# Patient Record
Sex: Female | Born: 1965 | Race: White | Hispanic: No | Marital: Single | Smoking: Current every day smoker
Health system: Southern US, Community
[De-identification: ages and names within clinical notes are randomized; demographics above are authoritative.]

---

## 1998-09-29 ENCOUNTER — Encounter: Payer: Self-pay | Admitting: Emergency Medicine

## 1998-09-29 ENCOUNTER — Emergency Department (HOSPITAL_COMMUNITY): Admission: EM | Admit: 1998-09-29 | Discharge: 1998-09-29 | Payer: Self-pay | Admitting: Emergency Medicine

## 2000-05-18 ENCOUNTER — Emergency Department (HOSPITAL_COMMUNITY): Admission: EM | Admit: 2000-05-18 | Discharge: 2000-05-18 | Payer: Self-pay | Admitting: Emergency Medicine

## 2000-05-18 ENCOUNTER — Encounter: Payer: Self-pay | Admitting: Emergency Medicine

## 2001-01-08 ENCOUNTER — Emergency Department (HOSPITAL_COMMUNITY): Admission: EM | Admit: 2001-01-08 | Discharge: 2001-01-08 | Payer: Self-pay | Admitting: Emergency Medicine

## 2007-03-26 ENCOUNTER — Emergency Department (HOSPITAL_COMMUNITY): Admission: EM | Admit: 2007-03-26 | Discharge: 2007-03-26 | Payer: Self-pay | Admitting: Emergency Medicine

## 2007-07-02 ENCOUNTER — Emergency Department (HOSPITAL_COMMUNITY): Admission: EM | Admit: 2007-07-02 | Discharge: 2007-07-02 | Payer: Self-pay | Admitting: Emergency Medicine

## 2007-08-05 ENCOUNTER — Emergency Department (HOSPITAL_COMMUNITY): Admission: EM | Admit: 2007-08-05 | Discharge: 2007-08-05 | Payer: Self-pay | Admitting: Emergency Medicine

## 2007-09-19 ENCOUNTER — Ambulatory Visit: Payer: Self-pay | Admitting: *Deleted

## 2007-09-19 ENCOUNTER — Emergency Department (HOSPITAL_COMMUNITY): Admission: EM | Admit: 2007-09-19 | Discharge: 2007-09-19 | Payer: Self-pay | Admitting: Emergency Medicine

## 2007-09-19 ENCOUNTER — Inpatient Hospital Stay (HOSPITAL_COMMUNITY): Admission: AD | Admit: 2007-09-19 | Discharge: 2007-09-21 | Payer: Self-pay | Admitting: *Deleted

## 2008-10-06 ENCOUNTER — Emergency Department (HOSPITAL_COMMUNITY): Admission: EM | Admit: 2008-10-06 | Discharge: 2008-10-06 | Payer: Self-pay | Admitting: Emergency Medicine

## 2009-02-06 ENCOUNTER — Observation Stay (HOSPITAL_COMMUNITY): Admission: EM | Admit: 2009-02-06 | Discharge: 2009-02-06 | Payer: Self-pay | Admitting: Emergency Medicine

## 2009-03-22 ENCOUNTER — Emergency Department (HOSPITAL_COMMUNITY): Admission: EM | Admit: 2009-03-22 | Discharge: 2009-03-22 | Payer: Self-pay | Admitting: Emergency Medicine

## 2011-02-21 LAB — POCT I-STAT, CHEM 8
BUN: 4 mg/dL — ABNORMAL LOW (ref 6–23)
Calcium, Ion: 1.13 mmol/L (ref 1.12–1.32)
Chloride: 108 mEq/L (ref 96–112)
Creatinine, Ser: 0.8 mg/dL (ref 0.4–1.2)
Glucose, Bld: 112 mg/dL — ABNORMAL HIGH (ref 70–99)
HCT: 42 % (ref 36.0–46.0)
Hemoglobin: 14.3 g/dL (ref 12.0–15.0)
Potassium: 3.3 mEq/L — ABNORMAL LOW (ref 3.5–5.1)
Sodium: 142 mEq/L (ref 135–145)
TCO2: 22 mmol/L (ref 0–100)

## 2011-02-21 LAB — POCT CARDIAC MARKERS
CKMB, poc: <1 ng/mL — ABNORMAL LOW (ref 1.0–8.0)
Myoglobin, poc: 46.5 ng/mL (ref 12–200)
Troponin i, poc: <0.05 ng/mL (ref 0.00–0.09)

## 2011-02-23 LAB — CBC
MCV: 91.7 fL (ref 78.0–100.0)
RBC: 4.32 MIL/uL (ref 3.87–5.11)
WBC: 10.9 10*3/uL — ABNORMAL HIGH (ref 4.0–10.5)

## 2011-02-23 LAB — URINALYSIS, ROUTINE W REFLEX MICROSCOPIC
Glucose, UA: NEGATIVE mg/dL
Specific Gravity, Urine: 1.011 (ref 1.005–1.030)
pH: 6 (ref 5.0–8.0)

## 2011-02-23 LAB — DIFFERENTIAL
Basophils Relative: 1 % (ref 0–1)
Eosinophils Relative: 2 % (ref 0–5)
Lymphocytes Relative: 19 % (ref 12–46)
Neutrophils Relative %: 74 % (ref 43–77)

## 2011-02-23 LAB — BASIC METABOLIC PANEL
Chloride: 107 mEq/L (ref 96–112)
Creatinine, Ser: 0.66 mg/dL (ref 0.4–1.2)
GFR calc Af Amer: >60 mL/min (ref >60)

## 2011-02-23 LAB — URINE MICROSCOPIC-ADD ON

## 2011-03-28 NOTE — H&P (Signed)
[name], [name]                 ACCOUNT NO.:  [id number]   MEDICAL RECORD NO.:  [id number]          PATIENT TYPE:  IPS   LOCATION:  0602                          FACILITY:  [facility]   PHYSICIAN:  Jasmine Pang, M.D. DATE OF BIRTH:  Oct 11, 1966   DATE OF ADMISSION:  09/19/2007  DATE OF DISCHARGE:  09/21/2007                       PSYCHIATRIC ADMISSION ASSESSMENT   DATE OF ASSESSMENT:  September 20, 2007 at 12:20 p.m.   IDENTIFYING INFORMATION:  A 45 year old white female who is divorced.  This is an involuntary admission.   HISTORY OF PRESENT ILLNESS:  The patient was brought to the hospital  after her mother could not awaken her after she had taken some pills for  a headache.  She says that she took approximately 7 Cephadyn tablets  prescribed by her PCP for headache. Also had reported that she had taken  some additional Tylenol. The patient today denies that she had suicidal  intent and said that she was miserable though and just wanted the  headache to stop.  She denies depressed mood.  Denies depressive  symptoms.  It is also noted that the EMS had reported that the patient  had presented with superficial cuts to her left wrist, and today she is  reporting that these were not self-inflicted but incurred while she was  cleaning up wood in the yard.  She is endorsing occasional use of  marijuana last couple of weeks ago.  Denies any other substance abuse.  Denying any suicidal intent.   PAST PSYCHIATRIC HISTORY:  The patient is here in [facility] mental  health sponsorship. She has no prior history of psychiatric treatment.  Denies any prior hospitalizations. Denies that she has ever taken  medications for depression.  Denies a history of substance abuse other  than the cannabis.   SOCIAL HISTORY:  Divorced white female, has not worked since November of  2007, admitting to having some chronic conflict with her boyfriend. Has  a history of having three children, two who died  at birth, one in the  custody of another friend. She has a tenth grade education, completed a  GED, and has previously worked as a Conservator, museum/gallery.  Currently unemployed. Living with and financially dependent on her  mother who is 3 years of age.   FAMILY HISTORY:  The patient is adopted and has no knowledge of her  biological parents.   ALCOHOL AND DRUG HISTORY:  Noted above.   MEDICAL HISTORY:  Patient is followed by Tammy R. Collins Scotland, M.D. of the  [facility]. Medical problems are migraine headaches. Past medical  history is remarkable for history of seizures with preeclampsia during  her pregnancies and three prior pregnancies.   CURRENT MEDICATIONS:  Cephadyn, which is Tylenol and butalbital 50 mg,  Apap 650 mg.   DRUG ALLERGIES:  NO KNOWN DRUG ALLERGIES.   POSITIVE PHYSICAL FINDINGS:  The patient has some superficial scratches  on her left wrist which do look recent. Did not require suturing. She is  a thin, small built female in no acute distress.  VITAL SIGNS:  On admission to  the unit, temperature 98.2, pulse 61,  respirations 14, blood pressure 106/65.  She is about 5 feet 5 inches  tall and weighed 48 kg.   DIAGNOSTIC STUDIES:  Alcohol level was less than 5.  Urine drug screen  positive for marijuana.  Salicylate level less than 4. Acetaminophen  level was less than 10.  Her CBC WBC 10.8, hemoglobin of 13.7,  hematocrit 39.8, platelets 206,000. Her chemistry sodium 140, potassium  3.7, chloride 110, carbon dioxide 21, BUN 8, creatinine 0.74 and random  glucose 102. Her liver enzymes were normal.  Urine pregnancy test  negative.  Alcohol level less than 5.   MENTAL STATUS EXAM:  Fully alert female.  She is cooperative, rather  solicitous today wanting to be discharged, sees no reason that she needs  to be here.  She had been agitated and uncooperative in the emergency  room and has been irritable with staff somewhat this morning but is  polite and  cooperative in interview today.  Speech is normal in pace,  tone and production, relevant.  Mood is neutral.  She is rather  defensive about her actions yesterday.  Denying any suicidal thoughts.  She is somewhat indignant about the petition but is polite and  cooperative here, requesting to be discharged but denying any suicidal  thought or homicidal thought.  Cognition is intact and oriented x3.  Concentration is within normal limits.  Insight adequate.  Impulse  control and judgment within normal limits.  Concentration and  calculation are intact.   IMPRESSION:  AXIS I:  Rule out depressive disorder NOS.  Cannabis abuse.  AXIS II:  Deferred.  AXIS III:  Status post acetaminophen overdose.  AXIS IV:  Moderate issues with unemployment.  AXIS V:  Current 39.  Past year not known.   PLAN:  To involuntarily admit the patient.  She is currently under  petition. Will give her trazodone 50 mg h.s. p.r.n. insomnia and have  her follow up with Dr. Herb Grays for her migraine headaches.  We are  going to get a hold of her mother today with whom she lives and see if  she can come in for a family session and get some additional information  from her family to corroborate her story and hear their concerns.  Estimated length of stay is 3-5 days.      Margaret A. Lorin Picket, N.P.      Jasmine Pang, M.D.  Electronically Signed    MAS/MEDQ  D:  09/20/2007  T:  09/22/2007  Job:  [id number]

## 2011-03-28 NOTE — Discharge Summary (Signed)
NAMEBRYER, [name]                 ACCOUNT NO.:  [id number]   MEDICAL RECORD NO.:  [id number]          PATIENT TYPE:  IPS   LOCATION:  0602                          FACILITY:  [facility]   PHYSICIAN:  Jasmine Pang, M.D. DATE OF BIRTH:  07/31/1966   DATE OF ADMISSION:  09/19/2007  DATE OF DISCHARGE:  09/21/2007                               DISCHARGE SUMMARY   IDENTIFICATION:  A 45 year old single white female from [place],  [place] who is admitted on involuntary basis.   HISTORY OF PRESENT ILLNESS:  The patient reports increasing depression  after losing her job recently.  She states I am just depressed and I  want to go home.  She states she takes __________  for migraines and it  is no longer effective.  She has been having severe migraines lately.  She reports taking a handful of __________  (has Tylenol in it) to  ease her migraine.  The patient is overwhelmed by financial problems due  to no work.  She is to go for a [employer] in a sponsorship.  She has had no prior treatment.  Here, she has had no prior  hospitalizations.  She has hypertension.  She uses __________  for  migraines.   ALLERGIES:  She is allergic to PENICILLIN AND ASPIRIN.   PHYSICAL FINDINGS:  There are no acute physical or medical findings.  She was evaluated in the ED and the patient was healthy.   LABORATORY DATA:  The admission laboratories, alcohol level was less  than 5.  UDS was positive for marijuana and salicylates less than 4,  acetaminophen less than 10.   HOSPITAL COURSE:  Upon admission, the patient did not want to be on any  medication.  She was friendly, cooperative, and discussed her history of  migraines.  She denied she was suicidal.  She stated, she took extra  medication and her mother could not wake her up easily.  She states that  her words were taken out of contacts when it was reported that she said  she wanted to die.  Her mental status was improved.  She was  initially  anxious about being admitted but on 09/21/2007, she was friendly and  cooperative with good eye contact.  Speech was normal rate and flow.  Psychomotor activity was within normal limits.  Mood was euthymic.  Affect wide range.  There was no suicidal or homicidal ideation.  No  thoughts of self-injuries behavior.  No auditory visual hallucinations.  No paranoid delusions.  Thoughts were logical and goal directed.  Thought content, no predominant theme.  Cognitive was grossly back to  baseline, which was within normal limits.  The patient was felt safe to  be discharged today.  Her mother was going to pick her up.   DISCHARGE DIAGNOSIS:  AXIS I:  Depressive disorder, not otherwise  specified, and also for cannabis abuse.  AXIS II:  None.  AXIS III:  Status post acetaminophen overdose, medically stable.  AXIS IV:  Moderate (issues with occupational problem and economic  problem, burden of migraines).  AXIS  V:  Global assessment of functioning on discharge was 50, global  assessment of functioning on admission was 39, and global assessment of  functioning highest past year was 65 to 70.   DISCHARGE PLAN:  There were no specific activity level or dietary  restrictions.   POSTHOSPITAL CARE PLANS:  The patient will not see a psychiatrist or  therapist.  She states, she does not want this and does not feel she  needs it.  She will follow up with Dr. Herb Grays in 2 weeks for her  migraine headaches.   DISCHARGE MEDICATIONS:  None.      Jasmine Pang, M.D.  Electronically Signed     BHS/MEDQ  D:  09/21/2007  T:  09/21/2007  Job:  [id number]

## 2011-08-15 LAB — URINALYSIS, ROUTINE W REFLEX MICROSCOPIC
Ketones, ur: >80 — AB
Nitrite: NEGATIVE
Protein, ur: NEGATIVE

## 2011-08-15 LAB — URINE MICROSCOPIC-ADD ON

## 2011-08-15 LAB — POCT PREGNANCY, URINE: Preg Test, Ur: NEGATIVE

## 2011-08-22 LAB — COMPREHENSIVE METABOLIC PANEL
ALT: 10
AST: 18
Alkaline Phosphatase: 46
GFR calc Af Amer: >60
Glucose, Bld: 102 — ABNORMAL HIGH
Potassium: 3.7
Sodium: 140
Total Protein: 7.1

## 2011-08-22 LAB — PREGNANCY, URINE: Preg Test, Ur: NEGATIVE

## 2011-08-22 LAB — DIFFERENTIAL
Basophils Relative: 0
Eosinophils Absolute: 0
Eosinophils Relative: 0
Lymphs Abs: 2.9
Monocytes Absolute: 0.6
Monocytes Relative: 6
Neutrophils Relative %: 67

## 2011-08-22 LAB — ACETAMINOPHEN LEVEL
Acetaminophen (Tylenol), Serum: <10 — ABNORMAL LOW
Acetaminophen (Tylenol), Serum: <10 — ABNORMAL LOW

## 2011-08-22 LAB — RAPID URINE DRUG SCREEN, HOSP PERFORMED
Amphetamines: NOT DETECTED
Barbiturates: NOT DETECTED
Benzodiazepines: NOT DETECTED
Tetrahydrocannabinol: POSITIVE — AB

## 2011-08-22 LAB — CBC
Hemoglobin: 13.7
RBC: 4.48
RDW: 14.1 — ABNORMAL HIGH

## 2011-08-22 LAB — ETHANOL: Alcohol, Ethyl (B): <5

## 2015-02-07 ENCOUNTER — Encounter (HOSPITAL_COMMUNITY): Payer: Self-pay | Admitting: *Deleted

## 2015-02-07 ENCOUNTER — Emergency Department (HOSPITAL_COMMUNITY)
Admission: EM | Admit: 2015-02-07 | Discharge: 2015-02-07 | Disposition: A | Discharge status: Home / Self Care | Payer: Self-pay | Attending: Emergency Medicine | Admitting: Emergency Medicine

## 2015-02-07 ENCOUNTER — Emergency Department (HOSPITAL_COMMUNITY): Payer: Self-pay

## 2015-02-07 DIAGNOSIS — S52201A Unspecified fracture of shaft of right ulna, initial encounter for closed fracture: Secondary | ICD-10-CM

## 2015-02-07 DIAGNOSIS — W228XXA Striking against or struck by other objects, initial encounter: Secondary | ICD-10-CM | POA: Insufficient documentation

## 2015-02-07 DIAGNOSIS — Y9389 Activity, other specified: Secondary | ICD-10-CM | POA: Insufficient documentation

## 2015-02-07 DIAGNOSIS — Y998 Other external cause status: Secondary | ICD-10-CM | POA: Insufficient documentation

## 2015-02-07 DIAGNOSIS — Y929 Unspecified place or not applicable: Secondary | ICD-10-CM | POA: Insufficient documentation

## 2015-02-07 DIAGNOSIS — S52601A Unspecified fracture of lower end of right ulna, initial encounter for closed fracture: Secondary | ICD-10-CM | POA: Insufficient documentation

## 2015-02-07 DIAGNOSIS — Z720 Tobacco use: Secondary | ICD-10-CM | POA: Insufficient documentation

## 2015-02-07 MED ORDER — NAPROXEN 250 MG PO TABS
500.0000 mg | ORAL_TABLET | Freq: Once | ORAL | Status: AC
  Administered 2015-02-07: 500 mg via ORAL
  Filled 2015-02-07: qty 2

## 2015-02-07 MED ORDER — OXYCODONE-ACETAMINOPHEN 5-325 MG PO TABS: 1.0000 | ORAL_TABLET | Freq: Four times a day (QID) | ORAL | Status: AC | PRN

## 2015-02-07 MED ORDER — OXYCODONE-ACETAMINOPHEN 5-325 MG PO TABS
1.0000 | ORAL_TABLET | Freq: Once | ORAL | Status: AC
  Administered 2015-02-07: 1 via ORAL
  Filled 2015-02-07: qty 1

## 2015-02-07 MED ORDER — NAPROXEN 500 MG PO TABS
500.0000 mg | ORAL_TABLET | Freq: Two times a day (BID) | ORAL | Status: AC
Start: 2015-02-07 — End: ?

## 2015-02-07 NOTE — ED Notes (Signed)
The pt was p,laying with her dig ans struck her   Rt wrist on the sofa c/o pain since then

## 2015-02-07 NOTE — Discharge Instructions (Signed)
Recommend elevation as much as possible and icing 3-4 times per day. Take Naproxen as prescribed and percocet as needed for severe pain. Follow up with an orthopedic hand specialist to ensure proper healing. Do not remove your splint unless otherwise instructed by an orthopedic hand specialist.  Forearm Fracture Your caregiver has diagnosed you as having a broken bone (fracture) of the forearm. This is the part of your arm between the elbow and your wrist. Your forearm is made up of two bones. These are the radius and ulna. A fracture is a break in one or both bones. A cast or splint is used to protect and keep your injured bone from moving. The cast or splint will be on generally for about 5 to 6 weeks, with individual variations. HOME CARE INSTRUCTIONS   Keep the injured part elevated while sitting or lying down. Keeping the injury above the level of your heart (the center of the chest). This will decrease swelling and pain.  Apply ice to the injury for 15-20 minutes, 03-04 times per day while awake, for 2 days. Put the ice in a plastic bag and place a thin towel between the bag of ice and your cast or splint.  If you have a plaster or fiberglass cast:  Do not try to scratch the skin under the cast using sharp or pointed objects.  Check the skin around the cast every day. You may put lotion on any red or sore areas.  Keep your cast dry and clean.  If you have a plaster splint:  Wear the splint as directed.  You may loosen the elastic around the splint if your fingers become numb, tingle, or turn cold or blue.  Do not put pressure on any part of your cast or splint. It may break. Rest your cast only on a pillow the first 24 hours until it is fully hardened.  Your cast or splint can be protected during bathing with a plastic bag. Do not lower the cast or splint into water.  Only take over-the-counter or prescription medicines for pain, discomfort, or fever as directed by your  caregiver. SEEK IMMEDIATE MEDICAL CARE IF:   Your cast gets damaged or breaks.  You have more severe pain or swelling than you did before the cast.  Your skin or nails below the injury turn blue or gray, or feel cold or numb.  There is a bad smell or new stains and/or pus like (purulent) drainage coming from under the cast. MAKE SURE YOU:   Understand these instructions.  Will watch your condition.  Will get help right away if you are not doing well or get worse. Document Released: 10/27/2000 Document Revised: 01/22/2012 Document Reviewed: 06/18/2008 [facility] Patient Information 2015 [place]. This information is not intended to replace advice given to you by your health care provider. Make sure you discuss any questions you have with your health care provider.  RICE: Routine Care for Injuries The routine care of many injuries includes Rest, Ice, Compression, and Elevation (RICE). HOME CARE INSTRUCTIONS  Rest is needed to allow your body to heal. Routine activities can usually be resumed when comfortable. Injured tendons and bones can take up to 6 weeks to heal. Tendons are the cord-like structures that attach muscle to bone.  Ice following an injury helps keep the swelling down and reduces pain.  Put ice in a plastic bag.  Place a towel between your skin and the bag.  Leave the ice on for 15-20 minutes,  3-4 times a day, or as directed by your health care provider. Do this while awake, for the first 24 to 48 hours. After that, continue as directed by your caregiver.  Compression helps keep swelling down. It also gives support and helps with discomfort. If an elastic bandage has been applied, it should be removed and reapplied every 3 to 4 hours. It should not be applied tightly, but firmly enough to keep swelling down. Watch fingers or toes for swelling, bluish discoloration, coldness, numbness, or excessive pain. If any of these problems occur, remove the bandage and reapply  loosely. Contact your caregiver if these problems continue.  Elevation helps reduce swelling and decreases pain. With extremities, such as the arms, hands, legs, and feet, the injured area should be placed near or above the level of the heart, if possible. SEEK IMMEDIATE MEDICAL CARE IF:  You have persistent pain and swelling.  You develop redness, numbness, or unexpected weakness.  Your symptoms are getting worse rather than improving after several days. These symptoms may indicate that further evaluation or further X-rays are needed. Sometimes, X-rays may not show a small broken bone (fracture) until 1 week or 10 days later. Make a follow-up appointment with your caregiver. Ask when your X-ray results will be ready. Make sure you get your X-ray results. Document Released: 02/11/2001 Document Revised: 11/04/2013 Document Reviewed: 03/31/2011 [facility] Patient Information 2015 [place], [place]. This information is not intended to replace advice given to you by your health care provider. Make sure you discuss any questions you have with your health care provider.

## 2015-02-07 NOTE — ED Provider Notes (Signed)
CSN: [id number]     Arrival date & time 02/07/15  0141 History   First MD Initiated Contact with Patient 02/07/15 0258     Chief Complaint  Patient presents with  . Wrist Pain     (Consider location/radiation/quality/duration/timing/severity/associated sxs/prior Treatment) HPI Comments: 49 year old female presents to the emergency department for further evaluation of right forearm pain. Symptom onset after wrist and forearm struck sofa while patient was playing with her [place] dog. Impression and icing attempted prior to arrival. No medications taken for symptoms.  Patient is a 49 y.o. female presenting with wrist pain and arm injury. The history is provided by the patient. No language interpreter was used.  Wrist Pain Associated symptoms include arthralgias and joint swelling.  Arm Injury Location:  Arm Arm location:  R forearm Pain details:    Quality:  Sharp and aching   Radiates to:  Does not radiate   Severity:  Moderate   Onset quality:  Sudden   Duration:  4 hours   Timing:  Constant   Progression:  Waxing and waning Chronicity:  New Dislocation: no   Prior injury to area:  No Relieved by: compression and icing. Worsened by:  Movement Ineffective treatments:  Rest Associated symptoms: swelling and tingling   Associated symptoms: no decreased range of motion, no muscle weakness and no numbness   Risk factors: no frequent fractures     History reviewed. No pertinent past medical history. History reviewed. No pertinent past surgical history. No family history on file. History  Substance Use Topics  . Smoking status: Current Every Day Smoker  . Smokeless tobacco: Not on file  . Alcohol Use: No   OB History    No data available      Review of Systems  Musculoskeletal: Positive for joint swelling and arthralgias.  All other systems reviewed and are negative.   Allergies  Tagamet  Home Medications   Prior to Admission medications   Medication Sig  Start Date End Date Taking? Authorizing Provider  naproxen (NAPROSYN) 500 MG tablet Take 1 tablet (500 mg total) by mouth 2 (two) times daily. 02/07/15   Antony MaduraKelly Andraya Frigon, PA-C  oxyCODONE-acetaminophen (PERCOCET/ROXICET) 5-325 MG per tablet Take 1 tablet by mouth every 6 (six) hours as needed for severe pain. 02/07/15   Antony MaduraKelly Silena Wyss, PA-C   BP 122/74 mmHg  Pulse 70  Temp(Src) 98 F (36.7 C)  Resp 18  Ht 5' 5.5" (1.664 m)  Wt 109 lb (49.442 kg)  BMI 17.86 kg/m2  SpO2 97%   Physical Exam  Constitutional: She is oriented to person, place, and time. She appears well-developed and well-nourished. No distress.  Nontoxic/nonseptic appearing  HENT:  Head: Normocephalic and atraumatic.  Eyes: Conjunctivae and EOM are normal. No scleral icterus.  Neck: Normal range of motion.  Cardiovascular: Normal rate, regular rhythm and intact distal pulses.   Distal radial pulse 2+ in right upper extremity. Capillary refill brisk in all digits of right hand.  Pulmonary/Chest: Effort normal. No respiratory distress.  Respirations even and unlabored  Musculoskeletal: Normal range of motion. She exhibits tenderness.       Right wrist: She exhibits normal range of motion, no bony tenderness, no swelling, no effusion and no crepitus.       Right forearm: She exhibits tenderness, bony tenderness and swelling. She exhibits no deformity and no laceration.       Arms:      Right hand: Normal.  TTP to R distal forearm along ulnar aspect  with mild soft tissue swelling and ecchymosis.  Neurological: She is alert and oriented to person, place, and time. She exhibits normal muscle tone. Coordination normal.  Sensation to light touch intact. Finger to thumb opposition intact. Good grip strength in the right upper extremity.  Skin: Skin is warm and dry. No rash noted. She is not diaphoretic. No erythema. No pallor.  Psychiatric: She has a normal mood and affect. Her behavior is normal.  Nursing note and vitals  reviewed.   ED Course  Procedures (including critical care time) Labs Review Labs Reviewed - No data to display  Imaging Review Dg Wrist Complete Right  02/07/2015   CLINICAL DATA:  Right wrist pain after injury, arm hit back of couch.  EXAM: RIGHT WRIST - COMPLETE 3+ VIEW  COMPARISON:  None.  FINDINGS: There is a nondisplaced fracture of the distal ulnar shaft. No extension to the distal radial ulnar joint. Distal radius is intact. Wrist alignment is maintained. No additional fracture.  IMPRESSION: Nondisplaced fracture of the distal ulnar shaft.   Electronically Signed   By: Rubye Oaks M.D.   On: 02/07/2015 02:52     EKG Interpretation None      MDM   Final diagnoses:  Ulnar shaft fracture, right, closed, initial encounter    49 year old female presents to the emergency department for further evaluation of right wrist/forearm pain after striking her arm on a sofa while playing with her dog this evening. Patient is neurovascularly intact with point tenderness along the ulnar aspect of her distal forearm. Point tenderness consistent with nondisplaced fracture site of the distal ulnar shaft. Patient splinted in ED and given Percocet for pain. She is stable for outpatient management and hand specialist follow-up to ensure proper healing. RICE and NSAIDs advised and return precautions provided. Patient agreeable to plan with no unaddressed concerns. Patient discharged in good condition.   Filed Vitals:   02/07/15 0146  BP: 122/74  Pulse: 70  Temp: 98 F (36.7 C)  Resp: 18  Height: 5' 5.5" (1.664 m)  Weight: 109 lb (49.442 kg)  SpO2: 97%     Antony Madura, PA-C 02/07/15 0327  Devoria Albe, MD 02/07/15 0745

## 2015-02-07 NOTE — Progress Notes (Signed)
Orthopedic Tech Progress Note Patient Details:  Georgina Peerina R Maddux 10/06/1966 [id number]  Ortho Devices Type of Ortho Device: Ulna gutter splint Ortho Device/Splint Interventions: Application   Haskell Flirtewsome, Galilee Pierron M 02/07/2015, 3:11 AM

## 2016-05-08 IMAGING — CR DG WRIST COMPLETE 3+V*R*
4 series · 4 of 4 positions shown · non-contrast
Comparison: None.

CLINICAL DATA: Right wrist pain after injury, arm hit back of
couch.

EXAM:
RIGHT WRIST - COMPLETE 3+ VIEW

[wrist pa]
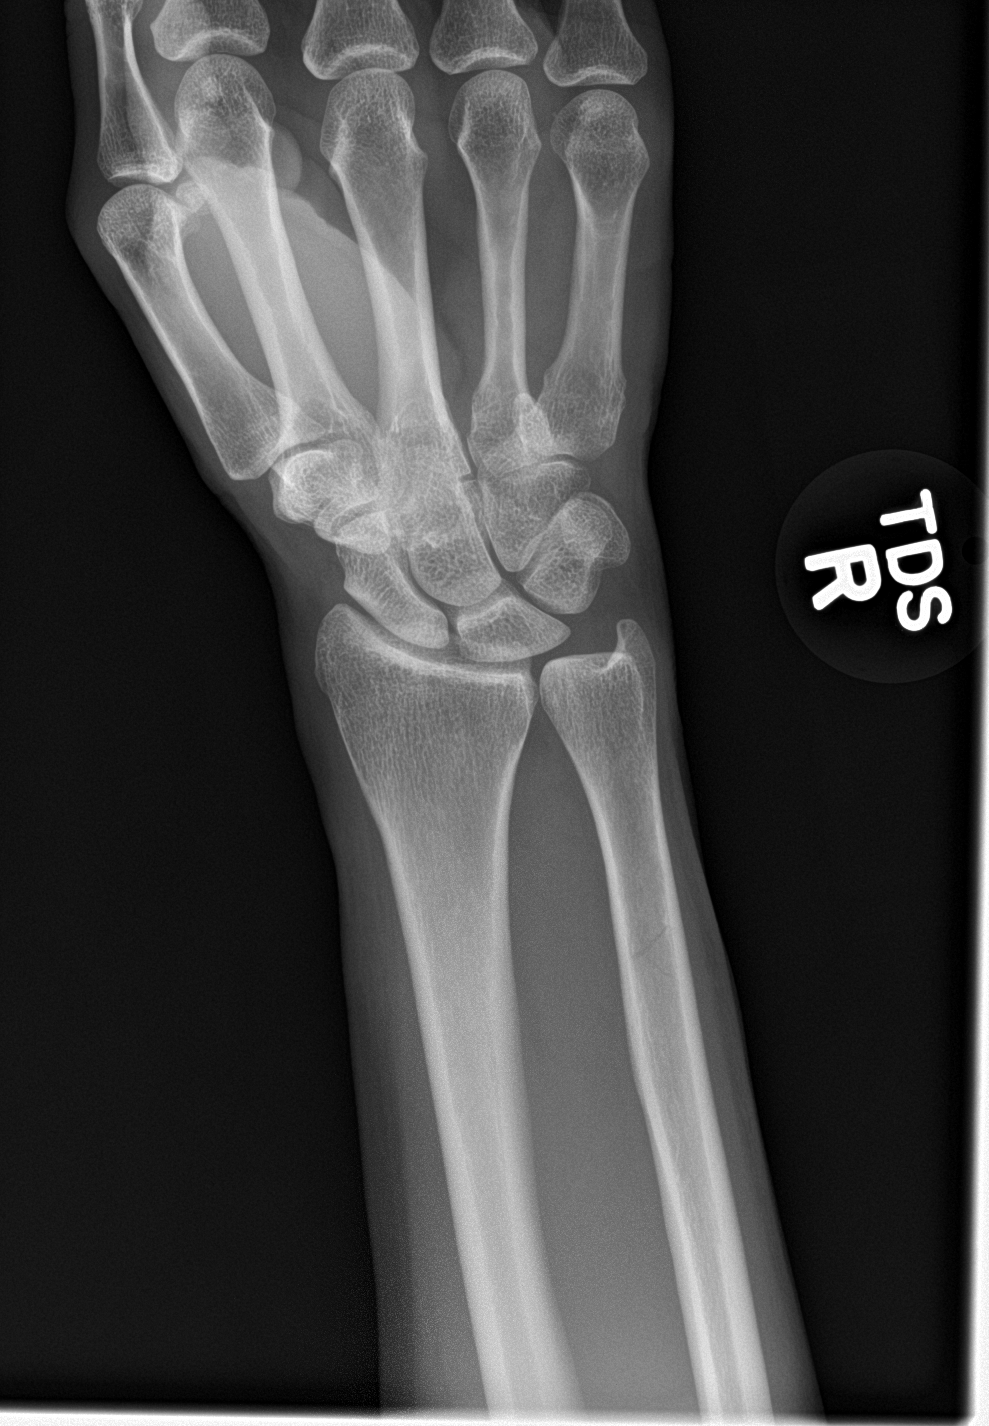

[wrist obl]
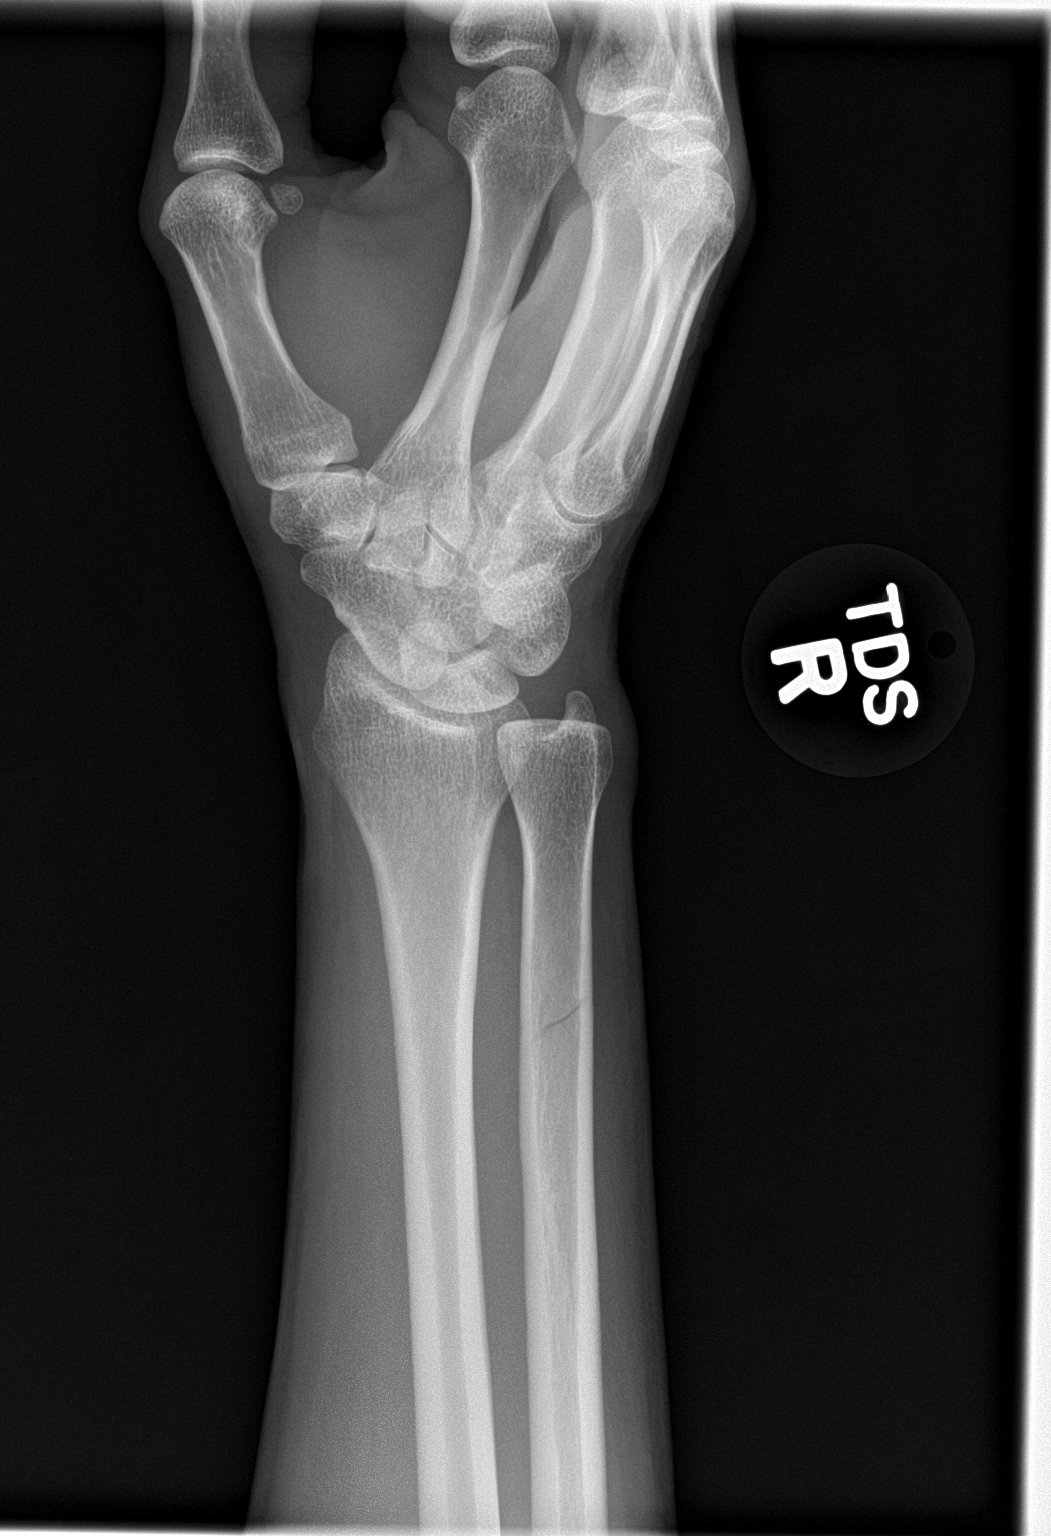

[wrist lat]
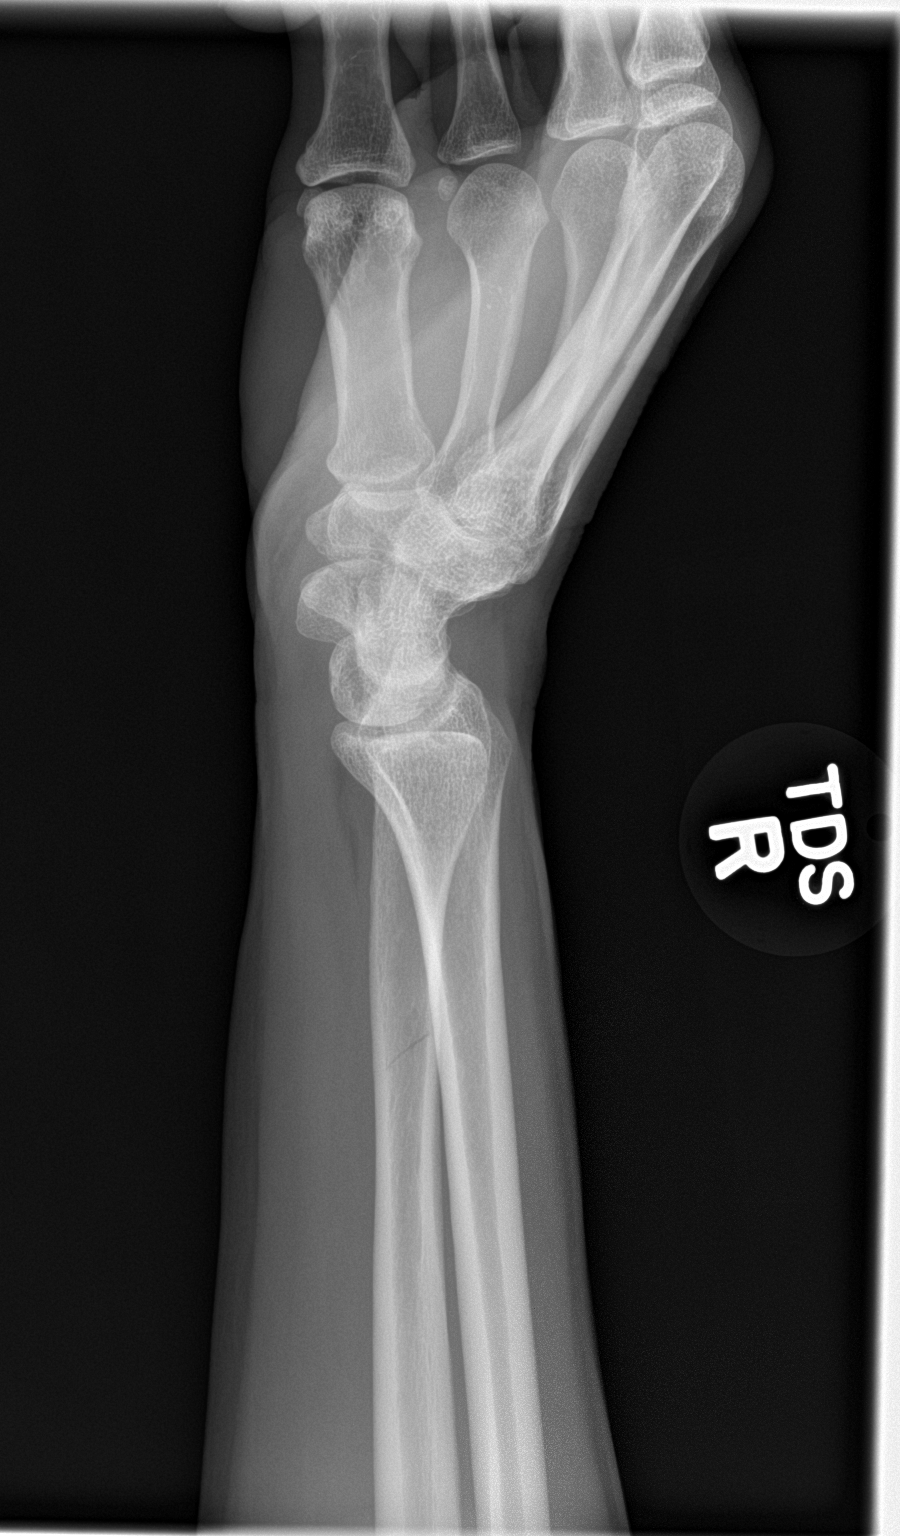

[wrist navicular]
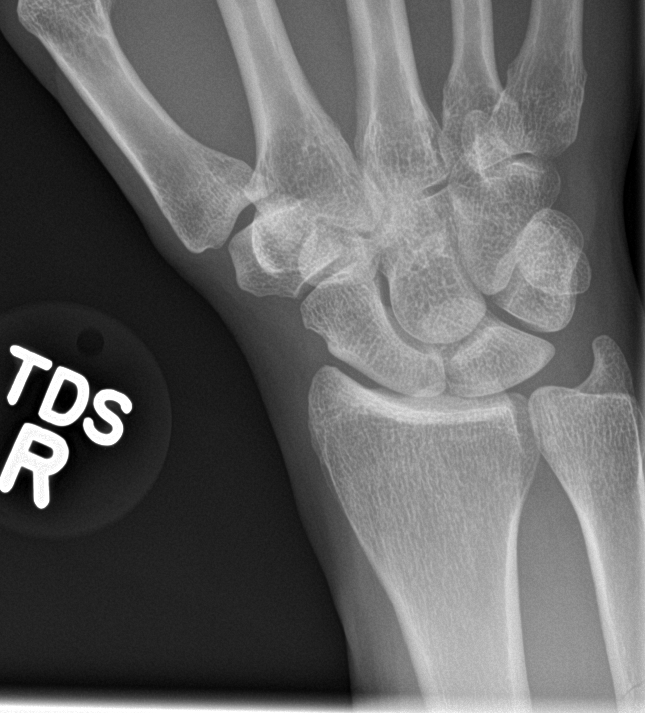

[4 of 4 positions shown; findings below may reference images not displayed]

FINDINGS: There is a nondisplaced fracture of the distal ulnar shaft. No
extension to the distal radial ulnar joint. Distal radius is intact.
Wrist alignment is maintained. No additional fracture.
IMPRESSION: Nondisplaced fracture of the distal ulnar shaft.

## 2016-08-04 ENCOUNTER — Encounter (HOSPITAL_COMMUNITY): Payer: Self-pay | Admitting: Emergency Medicine

## 2016-08-04 ENCOUNTER — Emergency Department (HOSPITAL_COMMUNITY)
Admission: EM | Admit: 2016-08-04 | Discharge: 2016-08-04 | Disposition: A | Discharge status: Home / Self Care | Payer: Self-pay | Attending: Emergency Medicine | Admitting: Emergency Medicine

## 2016-08-04 ENCOUNTER — Emergency Department (HOSPITAL_COMMUNITY): Payer: Self-pay

## 2016-08-04 DIAGNOSIS — R0789 Other chest pain: Secondary | ICD-10-CM | POA: Insufficient documentation

## 2016-08-04 DIAGNOSIS — F17200 Nicotine dependence, unspecified, uncomplicated: Secondary | ICD-10-CM | POA: Insufficient documentation

## 2016-08-04 DIAGNOSIS — R0781 Pleurodynia: Secondary | ICD-10-CM

## 2016-08-04 DIAGNOSIS — Z79899 Other long term (current) drug therapy: Secondary | ICD-10-CM | POA: Insufficient documentation

## 2016-08-04 MED ORDER — IBUPROFEN 800 MG PO TABS: 800.0000 mg | ORAL_TABLET | Freq: Three times a day (TID) | ORAL | 0 refills | Status: AC | PRN

## 2016-08-04 MED ORDER — HYDROCODONE-ACETAMINOPHEN 5-325 MG PO TABS
1.0000 | ORAL_TABLET | Freq: Once | ORAL | Status: AC
  Administered 2016-08-04: 1 via ORAL
  Filled 2016-08-04: qty 1

## 2016-08-04 MED ORDER — HYDROCODONE-ACETAMINOPHEN 5-325 MG PO TABS: 1.0000 | ORAL_TABLET | Freq: Four times a day (QID) | ORAL | 0 refills | Status: AC | PRN

## 2016-08-04 NOTE — ED Triage Notes (Signed)
Pt sts right rib and back pain worse with inspiration and cough after falling on Tuesday

## 2016-08-04 NOTE — ED Notes (Signed)
Pt states she understands instruction  And is home stable with boyfriend and with steady gait.

## 2016-08-04 NOTE — ED Notes (Signed)
Pts boyfriend at bedside.

## 2016-08-04 NOTE — ED Provider Notes (Signed)
[facility]-EMERGENCY DEPT Provider Note   CSN: [id number] Arrival date & time: 08/04/16  [id number]     History   Chief Complaint Chief Complaint  Patient presents with  . rib pain  . Back Pain    HPI [name] is a 50 y.o. female.  The history is provided by the patient and medical records. No language interpreter was used.  Back Pain   Pertinent negatives include no fever, no headaches, no abdominal pain and no dysuria.   [name] is an otherwise healthy 50 y.o. female  who presents to the Emergency Department complaining of worsening right lateral rib pain x 3 days which began shortly after moving furniture. No other injury or fall. Pain is worse with certain movements and deep breathing. Tylenol and ice with no relief. No chest pain, shortness of breath, abdominal pain. Patient denies back pain but states that rib pain does feel like it will radiate to her back with certain movements.   History reviewed. No pertinent past medical history.  There are no active problems to display for this patient.   History reviewed. No pertinent surgical history.  OB History    No data available       Home Medications    Prior to Admission medications   Medication Sig Start Date End Date Taking? Authorizing Provider  acetaminophen (TYLENOL) 500 MG tablet Take 500 mg by mouth every 6 (six) hours as needed for mild pain.   Yes Historical Provider, MD  ibuprofen (ADVIL,MOTRIN) 200 MG tablet Take 400 mg by mouth every 6 (six) hours as needed.   Yes Historical Provider, MD  HYDROcodone-acetaminophen (NORCO/VICODIN) 5-325 MG tablet Take 1 tablet by mouth every 6 (six) hours as needed for severe pain. 08/04/16   Chase PicketJaime Pilcher Terrea Bruster, PA-C  ibuprofen (ADVIL,MOTRIN) 800 MG tablet Take 1 tablet (800 mg total) by mouth every 8 (eight) hours as needed for mild pain or moderate pain. 08/04/16   Chase PicketJaime Pilcher Enrrique Mierzwa, PA-C  naproxen (NAPROSYN) 500 MG tablet Take 1 tablet (500 mg total) by mouth 2 (two)  times daily. Patient not taking: Reported on 08/04/2016 02/07/15   Antony MaduraKelly Humes, PA-C  oxyCODONE-acetaminophen (PERCOCET/ROXICET) 5-325 MG per tablet Take 1 tablet by mouth every 6 (six) hours as needed for severe pain. Patient not taking: Reported on 08/04/2016 02/07/15   Antony MaduraKelly Humes, PA-C    Family History History reviewed. No pertinent family history.  Social History Social History  Substance Use Topics  . Smoking status: Current Every Day Smoker  . Smokeless tobacco: Never Used  . Alcohol use No     Allergies   Tagamet [cimetidine]   Review of Systems Review of Systems  Constitutional: Negative for fever.  HENT: Negative for congestion.   Eyes: Negative for visual disturbance.  Respiratory: Negative for cough and shortness of breath.   Cardiovascular: Negative.   Gastrointestinal: Negative for abdominal pain, nausea and vomiting.  Genitourinary: Negative for dysuria.  Musculoskeletal: Positive for arthralgias and myalgias.  Skin: Negative for color change and wound.  Neurological: Negative for headaches.     Physical Exam Updated Vital Signs BP 120/97 (BP Location: Left Arm)   Pulse 66   Temp 97.9 F (36.6 C) (Oral)   Resp 17   Ht 5\' 5"  (1.651 m)   Wt 52.6 kg   SpO2 100%   BMI 19.30 kg/m   Physical Exam  Constitutional: She is oriented to person, place, and time. She appears well-developed and well-nourished. No distress.  HENT:  Head: Normocephalic and atraumatic.  Cardiovascular: Normal rate, regular rhythm and normal heart sounds.   Pulmonary/Chest: Effort normal and breath sounds normal. No respiratory distress.  Abdominal: Soft. She exhibits no distension. There is no tenderness.  Musculoskeletal:  Full ROM. 5/5 muscle strength of bilateral UE's including grip strength. Pain reproducible to right lateral rib cage with movement (torso rotation and arm/shoulder elevation). No overlying erythema, ecchymosis, swelling or deformity. No crepitus.     Neurological: She is alert and oriented to person, place, and time.  Skin: Skin is warm and dry.  Nursing note and vitals reviewed.    ED Treatments / Results  Labs (all labs ordered are listed, but only abnormal results are displayed) Labs Reviewed - No data to display  EKG  EKG Interpretation  Date/Time:  Friday August 04 2016 09:52:56 EDT Ventricular Rate:  60 PR Interval:    QRS Duration: 86 QT Interval:  396 QTC Calculation: 396 R Axis:   83 Text Interpretation:  Sinus rhythm Non-specific ST-t changes No significant change since last tracing Confirmed by Bebe Shaggy  MD, DONALD ([id number]) on 08/04/2016 9:58:37 AM       Radiology Dg Ribs Unilateral W/chest Right  Result Date: 08/04/2016 CLINICAL DATA:  Right chest and rib pain for 3 days.  Cough. EXAM: RIGHT RIBS AND CHEST - 3+ VIEW COMPARISON:  03/22/2009 FINDINGS: No fracture or other bone lesions are seen involving the ribs. There is no evidence of pneumothorax or pleural effusion. Both lungs are clear. Heart size and mediastinal contours are within normal limits. IMPRESSION: Negative. Electronically Signed   By: Charlett Nose M.D.   On: 08/04/2016 08:48    Procedures Procedures (including critical care time)  Medications Ordered in ED Medications  HYDROcodone-acetaminophen (NORCO/VICODIN) 5-325 MG per tablet 1 tablet (1 tablet Oral Given 08/04/16 0911)     Initial Impression / Assessment and Plan / ED Course  I have reviewed the triage vital signs and the nursing notes.  Pertinent labs & imaging results that were available during my care of the patient were reviewed by me and considered in my medical decision making (see chart for details).  Clinical Course   [name] is a 50 y.o. female who presents to ED for right lateral rib cage pain x 3 days which began shortly after moving furniture.   EKG reviewed and reassuring. Pain is reproducible with movement. No chest pain, shortness of breath, n/v, diaphoresis.  Afebrile and hemodynamically stable. PERC negative. Likely musk etiology. [place] Controlled Substance DB consulted and no rx listed. Will given short course pain meds and NSAID's. Discussed symptomatic home care instructions. PCP follow up if no improvement. Return precautions discussed and all questions answered.  Patient discussed with Dr. Bebe Shaggy who agrees with treatment plan.    Final Clinical Impressions(s) / ED Diagnoses   Final diagnoses:  Rib pain on right side    New Prescriptions New Prescriptions   HYDROCODONE-ACETAMINOPHEN (NORCO/VICODIN) 5-325 MG TABLET    Take 1 tablet by mouth every 6 (six) hours as needed for severe pain.   IBUPROFEN (ADVIL,MOTRIN) 800 MG TABLET    Take 1 tablet (800 mg total) by mouth every 8 (eight) hours as needed for mild pain or moderate pain.     [facility] Shevon Sian, PA-C 08/04/16 1004    Zadie Rhine, MD 08/07/16 [phone]

## 2016-08-04 NOTE — ED Notes (Signed)
X-ray inadvertently clicked off

## 2016-08-04 NOTE — Discharge Instructions (Signed)
Take ibuprofen as needed for mild to moderate pain. Use Norco only as needed for severe pain - This can make you very drowsy - please do not drink alcohol, operate heavy machinery or drive on this medication.  In addition to these medication ice affected area for additional pain relief.  Is symptoms do not improve in the next week, follow up with your primary care physician.  Return to ER for new or worsening symptoms, any additional concerns.

## 2016-08-04 NOTE — ED Notes (Signed)
Patient placed back on monitor.

## 2017-12-01 ENCOUNTER — Emergency Department (HOSPITAL_COMMUNITY)
Admission: EM | Admit: 2017-12-01 | Discharge: 2017-12-01 | Disposition: A | Discharge status: Home / Self Care | Payer: Self-pay | Attending: Emergency Medicine | Admitting: Emergency Medicine

## 2017-12-01 ENCOUNTER — Encounter (HOSPITAL_COMMUNITY): Payer: Self-pay | Admitting: Emergency Medicine

## 2017-12-01 DIAGNOSIS — H9203 Otalgia, bilateral: Secondary | ICD-10-CM | POA: Insufficient documentation

## 2017-12-01 DIAGNOSIS — Z5321 Procedure and treatment not carried out due to patient leaving prior to being seen by health care provider: Secondary | ICD-10-CM | POA: Insufficient documentation

## 2017-12-01 NOTE — ED Triage Notes (Signed)
Pt reports 1 month of runny nose and bilateral ear pain. Pt also has pain to head. PT crying. States she has no insurance and that's why she has not been in. No ear pain at present.

## 2017-12-01 NOTE — ED Notes (Signed)
Called patient to be roomed, no answer. 

## 2017-12-01 NOTE — ED Notes (Signed)
Patient called x 4 with no response

## 2021-02-09 ENCOUNTER — Emergency Department (HOSPITAL_BASED_OUTPATIENT_CLINIC_OR_DEPARTMENT_OTHER)
Admission: EM | Admit: 2021-02-09 | Discharge: 2021-02-09 | Disposition: A | Discharge status: Home / Self Care | Payer: Self-pay | Attending: Emergency Medicine | Admitting: Emergency Medicine

## 2021-02-09 ENCOUNTER — Encounter (HOSPITAL_BASED_OUTPATIENT_CLINIC_OR_DEPARTMENT_OTHER): Payer: Self-pay

## 2021-02-09 ENCOUNTER — Emergency Department (HOSPITAL_BASED_OUTPATIENT_CLINIC_OR_DEPARTMENT_OTHER): Payer: Self-pay

## 2021-02-09 ENCOUNTER — Other Ambulatory Visit: Payer: Self-pay

## 2021-02-09 DIAGNOSIS — K0889 Other specified disorders of teeth and supporting structures: Secondary | ICD-10-CM | POA: Insufficient documentation

## 2021-02-09 DIAGNOSIS — H66001 Acute suppurative otitis media without spontaneous rupture of ear drum, right ear: Secondary | ICD-10-CM | POA: Insufficient documentation

## 2021-02-09 DIAGNOSIS — J069 Acute upper respiratory infection, unspecified: Secondary | ICD-10-CM | POA: Insufficient documentation

## 2021-02-09 DIAGNOSIS — H6121 Impacted cerumen, right ear: Secondary | ICD-10-CM | POA: Insufficient documentation

## 2021-02-09 DIAGNOSIS — F17210 Nicotine dependence, cigarettes, uncomplicated: Secondary | ICD-10-CM | POA: Insufficient documentation

## 2021-02-09 DIAGNOSIS — H73892 Other specified disorders of tympanic membrane, left ear: Secondary | ICD-10-CM | POA: Insufficient documentation

## 2021-02-09 MED ORDER — AMOXICILLIN 500 MG PO CAPS
500.0000 mg | ORAL_CAPSULE | Freq: Once | ORAL | Status: AC
  Administered 2021-02-09: 500 mg via ORAL
  Filled 2021-02-09: qty 1

## 2021-02-09 MED ORDER — AMOXICILLIN 500 MG PO CAPS: 500.0000 mg | ORAL_CAPSULE | Freq: Two times a day (BID) | ORAL | 0 refills | Status: AC

## 2021-02-09 NOTE — ED Triage Notes (Signed)
Pt c/o flu like sx x 3 days-NAD-steady gait ?

## 2021-02-09 NOTE — ED Provider Notes (Signed)
[facility] EMERGENCY DEPARTMENT Provider Note   CSN: [id number] Arrival date & time: 02/09/21  2017     History Chief Complaint  Patient presents with  . Cough    [name] is a 55 y.o. female presenting for evaluation of ear pain. Patient reports she developed right ear pain 2 weeks ago. Initially she felt some clear discharge from her ear and then decided to irrigate her ear with peroxide and water without relief. Hearing sounds muffled and she is having intermittent twinges of pain to her right ear which are very bothersome.  She also states she was exposed to a cold from family members who tested negative for Covid.  She is having typical cold symptoms described as a "sinusitis" with nasal congestion, sore throat, cough.  Does not report any fevers.   Also reports left upper dental pain that began a couple of weeks ago, however that began feeling better.  Reports chronic problems with her ears, usually does the left.  Has seen ENT in the past, who instructed her to use the peroxide water rinses if she needs for any cerumen impaction.  Feels as though she needs her R ear irrigated here. No recent swimming.  The history is provided by the patient.       History reviewed. No pertinent past medical history.  There are no problems to display for this patient.   History reviewed. No pertinent surgical history.   OB History   No obstetric history on file.     No family history on file.  Social History   Tobacco Use  . Smoking status: Current Every Day Smoker    Types: Cigarettes  . Smokeless tobacco: Never Used  Substance Use Topics  . Alcohol use: No  . Drug use: No    Home Medications Prior to Admission medications   Medication Sig Start Date End Date Taking? Authorizing Provider  amoxicillin (AMOXIL) 500 MG capsule Take 1 capsule (500 mg total) by mouth 2 (two) times daily for 7 days. 02/09/21 02/16/21 Yes Audelia Knape, [place] N, PA-C  acetaminophen  (TYLENOL) 500 MG tablet Take 500 mg by mouth every 6 (six) hours as needed for mild pain.    [provider]  HYDROcodone-acetaminophen (NORCO/VICODIN) 5-325 MG tablet Take 1 tablet by mouth every 6 (six) hours as needed for severe pain. 08/04/16   Ward, Chase Picket, PA-C  ibuprofen (ADVIL,MOTRIN) 200 MG tablet Take 400 mg by mouth every 6 (six) hours as needed.    [provider]  ibuprofen (ADVIL,MOTRIN) 800 MG tablet Take 1 tablet (800 mg total) by mouth every 8 (eight) hours as needed for mild pain or moderate pain. 08/04/16   Ward, Chase Picket, PA-C  naproxen (NAPROSYN) 500 MG tablet Take 1 tablet (500 mg total) by mouth 2 (two) times daily. Patient not taking: No sig reported 02/07/15   Antony Madura, PA-C  oxyCODONE-acetaminophen (PERCOCET/ROXICET) 5-325 MG per tablet Take 1 tablet by mouth every 6 (six) hours as needed for severe pain. Patient not taking: No sig reported 02/07/15   Antony Madura, PA-C    Allergies    Tagamet [cimetidine]  Review of Systems   Review of Systems  Constitutional: Negative for fever.  HENT: Positive for congestion, ear discharge, ear pain and sore throat.   Respiratory: Positive for cough.   All other systems reviewed and are negative.   Physical Exam Updated Vital Signs BP (!) 129/94 (BP Location: Left Arm)   Pulse 94   Temp  98.5 F (36.9 C) (Oral)   Resp 17   Ht 5\' 3"  (1.6 m)   Wt 45.6 kg   SpO2 99%   BMI 17.81 kg/m   Physical Exam Vitals and nursing note reviewed.  Constitutional:      Appearance: She is well-developed.  HENT:     Head: Normocephalic and atraumatic.     Ears:     Comments: Left TM is mildly erythematous without effusion.  Right TM is unable to be visualized due to cerumen in the canal. Bilateral ears do not have tenderness to the tragus or pain with manipulation of the auricle. Once cerumen is removed - right TM is visualized and is bulging and erythematous with effusion    Nose: Rhinorrhea present.  No congestion.     Mouth/Throat:     Mouth: Mucous membranes are moist.     Pharynx: Oropharynx is clear.  Eyes:     Conjunctiva/sclera: Conjunctivae normal.  Cardiovascular:     Rate and Rhythm: Normal rate and regular rhythm.  Pulmonary:     Effort: Pulmonary effort is normal. No respiratory distress.     Breath sounds: Normal breath sounds.  Musculoskeletal:     Cervical back: Normal range of motion and neck supple. No tenderness.  Lymphadenopathy:     Cervical: No cervical adenopathy.  Neurological:     Mental Status: She is alert.  Psychiatric:        Mood and Affect: Mood normal.        Behavior: Behavior normal.     ED Results / Procedures / Treatments   Labs (all labs ordered are listed, but only abnormal results are displayed) Labs Reviewed - No data to display  EKG None  Radiology DG Chest [facility] 1 View  Result Date: 02/09/2021 CLINICAL DATA:  Cough and flu like symptoms for 3 days EXAM: PORTABLE CHEST 1 VIEW COMPARISON:  08/04/2016 FINDINGS: Chronic hyperinflation. No consolidation, features of edema, pneumothorax, or effusion. Pulmonary vascularity is normally distributed. The cardiomediastinal contours are unremarkable. No acute osseous or soft tissue abnormality. IMPRESSION: 1. No acute cardiopulmonary disease. 2. Chronic hyperinflation. Electronically Signed   By: 08/06/2016 M.D.   On: 02/09/2021 21:20    Procedures .Ear Cerumen Removal  Date/Time: 02/10/2021 12:40 AM Performed by: Mayan Dolney, 02/12/2021 N, PA-C Authorized by: Austynn Pridmore, [place] N, PA-C   Consent:    Consent obtained:  Verbal   Consent given by:  Patient   Risks, benefits, and alternatives were discussed: yes     Risks discussed:  Pain   Alternatives discussed:  No treatment Procedure details:    Location:  R ear   Procedure type: curette     Procedure outcomes: cerumen removed   Post-procedure details:    Inspection:  TM intact, ear canal clear and no bleeding   Post-procedure hearing  quality: no change.   Procedure completion:  Tolerated well, no immediate complications     Medications Ordered in ED Medications  amoxicillin (AMOXIL) capsule 500 mg (500 mg Oral Given 02/09/21 2333)    ED Course  I have reviewed the triage vital signs and the nursing notes.  Pertinent labs & imaging results that were available during my care of the patient were reviewed by me and considered in my medical decision making (see chart for details).    MDM Rules/Calculators/A&P                          Patient with exam  consistent with otitis media.  Did have exposure to family members who had cold symptoms, and tested negative for Covid.  She is only completed 1 of 2 Pfizer vaccines.  She declines Covid and flu twisting.  She is afebrile here, her lungs are clear.  Chest x-ray is negative.  Pharynx is benign on exam.  Will cover otitis media with amoxicillin, recommend outpatient follow-up and strict return precautions.  Patient is discharged in no distress.  Discussed results, findings, treatment and follow up. Patient advised of return precautions. Patient verbalized understanding and agreed with plan.  Final Clinical Impression(s) / ED Diagnoses Final diagnoses:  Non-recurrent acute suppurative otitis media of right ear without spontaneous rupture of tympanic membrane  Viral URI with cough    Rx / DC Orders ED Discharge Orders         Ordered    amoxicillin (AMOXIL) 500 MG capsule  2 times daily        02/09/21 2311           Shonte Soderlund, [place] N, [place] 02/10/21 0043    Koleen Distance, MD 02/10/21 1115

## 2021-02-09 NOTE — ED Notes (Signed)
Pt states had ear infection about 6 months ago then had some tooth issues now feels like ears are stopped up and needs to be cleaned out.

## 2021-02-09 NOTE — Discharge Instructions (Signed)
Please read instructions below.  You can take tylenol or ibuprofen as needed for sore throat or fever.  Drink plenty of water.  Use saline nasal spray for congestion. Take the antibiotic with food every 12 hours until gone. Return to the ER for worsening ear pain, inability to swallow liquids, difficulty breathing, or new or worsening symptoms.

## 2021-07-18 ENCOUNTER — Emergency Department (HOSPITAL_COMMUNITY)
Admission: EM | Admit: 2021-07-18 | Discharge: 2021-07-18 | Disposition: A | Discharge status: Home / Self Care | Payer: Self-pay | Attending: Emergency Medicine | Admitting: Emergency Medicine

## 2021-07-18 DIAGNOSIS — Z5321 Procedure and treatment not carried out due to patient leaving prior to being seen by health care provider: Secondary | ICD-10-CM | POA: Insufficient documentation

## 2021-07-18 DIAGNOSIS — U071 COVID-19: Secondary | ICD-10-CM | POA: Insufficient documentation

## 2021-07-18 DIAGNOSIS — H9311 Tinnitus, right ear: Secondary | ICD-10-CM | POA: Insufficient documentation

## 2021-07-18 LAB — COMPREHENSIVE METABOLIC PANEL
ALT: 18 U/L (ref 0–44)
AST: 29 U/L (ref 15–41)
Albumin: 3.8 g/dL (ref 3.5–5.0)
Alkaline Phosphatase: 66 U/L (ref 38–126)
Anion gap: 9 (ref 5–15)
BUN: 12 mg/dL (ref 6–20)
CO2: 22 mmol/L (ref 22–32)
Calcium: 8.8 mg/dL — ABNORMAL LOW (ref 8.9–10.3)
Chloride: 104 mmol/L (ref 98–111)
Creatinine, Ser: 0.78 mg/dL (ref 0.44–1.00)
GFR, Estimated: >60 mL/min (ref >60)
Glucose, Bld: 96 mg/dL (ref 70–99)
Potassium: 3.4 mmol/L — ABNORMAL LOW (ref 3.5–5.1)
Sodium: 135 mmol/L (ref 135–145)
Total Bilirubin: 0.6 mg/dL (ref 0.3–1.2)
Total Protein: 7 g/dL (ref 6.5–8.1)

## 2021-07-18 LAB — CBC WITH DIFFERENTIAL/PLATELET
Abs Immature Granulocytes: 0 10*3/uL (ref 0.00–0.07)
Basophils Absolute: 0 10*3/uL (ref 0.0–0.1)
Basophils Relative: 1 %
Eosinophils Absolute: 0 10*3/uL (ref 0.0–0.5)
Eosinophils Relative: 0 %
HCT: 41.8 % (ref 36.0–46.0)
Hemoglobin: 14.3 g/dL (ref 12.0–15.0)
Immature Granulocytes: 0 %
Lymphocytes Relative: 33 %
Lymphs Abs: 1.3 10*3/uL (ref 0.7–4.0)
MCH: 30.4 pg (ref 26.0–34.0)
MCHC: 34.2 g/dL (ref 30.0–36.0)
MCV: 88.9 fL (ref 80.0–100.0)
Monocytes Absolute: 0.5 10*3/uL (ref 0.1–1.0)
Monocytes Relative: 12 %
Neutro Abs: 2.1 10*3/uL (ref 1.7–7.7)
Neutrophils Relative %: 54 %
Platelets: 127 10*3/uL — ABNORMAL LOW (ref 150–400)
RBC: 4.7 MIL/uL (ref 3.87–5.11)
RDW: 13.2 % (ref 11.5–15.5)
WBC: 3.9 10*3/uL — ABNORMAL LOW (ref 4.0–10.5)
nRBC: 0 % (ref 0.0–0.2)

## 2021-07-18 LAB — URINALYSIS, ROUTINE W REFLEX MICROSCOPIC
Bilirubin Urine: NEGATIVE
Glucose, UA: NEGATIVE mg/dL
Ketones, ur: 80 mg/dL — AB
Leukocytes,Ua: NEGATIVE
Nitrite: NEGATIVE
Protein, ur: 30 mg/dL — AB
Specific Gravity, Urine: 1.024 (ref 1.005–1.030)
pH: 5 (ref 5.0–8.0)

## 2021-07-18 LAB — RESP PANEL BY RT-PCR (FLU A&B, COVID) ARPGX2
Influenza A by PCR: NEGATIVE
Influenza B by PCR: NEGATIVE
SARS Coronavirus 2 by RT PCR: POSITIVE — AB

## 2021-07-18 NOTE — ED Provider Notes (Signed)
Emergency Medicine Provider Triage Evaluation Note  [name] , a 55 y.o. female  was evaluated in triage.  Pt complains of body aches and ear pain. She reports intermittent ear pain for about a year with fatigue, poor appetite, states that she is "always running a fever."  She reports that she has lost 20 pounds in the past year.   She does not have a primary care doctor.  She also reports that she has had multiple tick bites in the past 3 months and got bit by her cat on her leg about a week ago.  She does not know when her last tetanus shot was, her cat is up-to-date on all vaccines per her report.  Review of Systems  Positive: Fever, fatigue, body aches Negative: rash  Physical Exam  BP (!) 143/79 (BP Location: Right Arm)   Pulse 89   Temp 100.1 F (37.8 C) (Oral)   Resp 18   SpO2 95%  Gen:   Awake, no distress   Resp:  Normal effort  MSK:   Moves extremities without difficulty  Other:  Wound on lower leg visualized.   Medical Decision Making  Medically screening exam initiated at 4:06 PM.  Appropriate orders placed.  SARY BOGIE was informed that the remainder of the evaluation will be completed by another provider, this initial triage assessment does not replace that evaluation, and the importance of remaining in the ED until their evaluation is complete.  Note: Portions of this report may have been transcribed using voice recognition software. Every effort was made to ensure accuracy; however, inadvertent computerized transcription errors may be present    Cristina Gong, PA-C 07/18/21 1611    Benjiman Core, MD 07/19/21 [phone]

## 2021-07-18 NOTE — ED Notes (Addendum)
Pt noted to not be in room at this time. Pt not found in nearby areas or bathrooms. MD made aware.

## 2021-07-18 NOTE — ED Triage Notes (Signed)
Pt states "for a year, I've been getting sick." Endorses HA, fever, sinus infection, teeth are impacted, states it's come & gone "every three weeks." Endorses significant lethargy, states she's lost approx 20lbs. States she's never seen PCP, seen at [facility] "once, but they didn't do anything." Vaccinated, unboosted, no sick contact  7/10 lower back pain, R ear ringing/sharp pain

## 2021-07-19 NOTE — ED Provider Notes (Signed)
Eloped from emergency department after MSE and labs.    Alvira Monday, MD 07/19/21 [phone]
